# Patient Record
Sex: Female | Born: 2000 | Race: White | Hispanic: No | Marital: Single | Smoking: Never smoker
Health system: Southern US, Community
[De-identification: ages and names within clinical notes are randomized; demographics above are authoritative.]

## PROBLEM LIST (undated history)

## (undated) DIAGNOSIS — Z789 Other specified health status: Secondary | ICD-10-CM

## (undated) HISTORY — DX: Other specified health status: Z78.9

## (undated) HISTORY — PX: WISDOM TOOTH EXTRACTION: SHX21

---
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
## 2019-11-07 ENCOUNTER — Other Ambulatory Visit (HOSPITAL_COMMUNITY)
Admission: RE | Admit: 2019-11-07 | Discharge: 2019-11-07 | Disposition: A | Discharge status: Home / Self Care | Payer: BC Managed Care – PPO | Source: Ambulatory Visit | Attending: Obstetrics and Gynecology | Admitting: Obstetrics and Gynecology

## 2019-11-07 ENCOUNTER — Other Ambulatory Visit: Payer: Self-pay

## 2019-11-07 ENCOUNTER — Ambulatory Visit (INDEPENDENT_AMBULATORY_CARE_PROVIDER_SITE_OTHER): Payer: BC Managed Care – PPO | Admitting: Obstetrics and Gynecology

## 2019-11-07 ENCOUNTER — Encounter: Payer: Self-pay | Admitting: Obstetrics and Gynecology

## 2019-11-07 VITALS — BP 118/70 | HR 67 | Resp 16 | Ht 70.0 in | Wt 226.2 lb

## 2019-11-07 DIAGNOSIS — Z0000 Encounter for general adult medical examination without abnormal findings: Secondary | ICD-10-CM | POA: Diagnosis not present

## 2019-11-07 DIAGNOSIS — Z124 Encounter for screening for malignant neoplasm of cervix: Secondary | ICD-10-CM

## 2019-11-07 DIAGNOSIS — Z01419 Encounter for gynecological examination (general) (routine) without abnormal findings: Secondary | ICD-10-CM | POA: Diagnosis not present

## 2019-11-07 DIAGNOSIS — Z113 Encounter for screening for infections with a predominantly sexual mode of transmission: Secondary | ICD-10-CM | POA: Diagnosis not present

## 2019-11-07 NOTE — Progress Notes (Signed)
Gynecology Annual Exam  PCP: Patient, No Pcp Per  Chief Complaint:  Chief Complaint  Patient presents with  . Gynecologic Exam    History of Present Illness: Patient is a 19 y.o. No obstetric history on file. presents for annual exam. The patient has no complaints today.   LMP: Patient's last menstrual period was 10/23/2019. Average Interval: regular, 28 days Duration of flow: 5 days Heavy Menses: no Clots: no Intermenstrual Bleeding: no Postcoital Bleeding: no Dysmenorrhea: no  The patient is not currently sexually active. She currently uses abstinence for contraception.  There is no notable family history of breast or ovarian cancer in her family.  The patient has regular exercise: yes.    The patient denies current symptoms of depression.    Review of Systems: Review of Systems  Constitutional: Negative for chills, fever, malaise/fatigue and weight loss.  HENT: Negative for congestion, hearing loss and sinus pain.   Eyes: Negative for blurred vision and double vision.  Respiratory: Negative for cough, sputum production, shortness of breath and wheezing.   Cardiovascular: Negative for chest pain, palpitations, orthopnea and leg swelling.  Gastrointestinal: Negative for abdominal pain, constipation, diarrhea, nausea and vomiting.  Genitourinary: Negative for dysuria, flank pain, frequency, hematuria and urgency.  Musculoskeletal: Negative for back pain, falls and joint pain.  Skin: Negative for itching and rash.  Neurological: Negative for dizziness and headaches.  Psychiatric/Behavioral: Negative for depression, substance abuse and suicidal ideas. The patient is not nervous/anxious.     Past Medical History:  There are no problems to display for this patient.   Past Surgical History:  History reviewed. No pertinent surgical history.  Gynecologic History:  Patient's last menstrual period was 10/23/2019. Contraception: abstinence Last Pap: never  Obstetric  History: No obstetric history on file.  Family History:  History reviewed. No pertinent family history.  Social History:  Social History   Socioeconomic History  . Marital status: Single    Spouse name: Not on file  . Number of children: Not on file  . Years of education: Not on file  . Highest education level: Not on file  Occupational History  . Not on file  Tobacco Use  . Smoking status: Not on file  Substance and Sexual Activity  . Alcohol use: Not on file  . Drug use: Not on file  . Sexual activity: Not on file  Other Topics Concern  . Not on file  Social History Narrative  . Not on file   Social Determinants of Health   Financial Resource Strain:   . Difficulty of Paying Living Expenses: Not on file  Food Insecurity:   . Worried About Programme researcher, broadcasting/film/video in the Last Year: Not on file  . Ran Out of Food in the Last Year: Not on file  Transportation Needs:   . Lack of Transportation (Medical): Not on file  . Lack of Transportation (Non-Medical): Not on file  Physical Activity:   . Days of Exercise per Week: Not on file  . Minutes of Exercise per Session: Not on file  Stress:   . Feeling of Stress : Not on file  Social Connections:   . Frequency of Communication with Friends and Family: Not on file  . Frequency of Social Gatherings with Friends and Family: Not on file  . Attends Religious Services: Not on file  . Active Member of Clubs or Organizations: Not on file  . Attends Banker Meetings: Not on file  . Marital  Status: Not on file  Intimate Partner Violence:   . Fear of Current or Ex-Partner: Not on file  . Emotionally Abused: Not on file  . Physically Abused: Not on file  . Sexually Abused: Not on file    Allergies:  No Known Allergies  Medications: Prior to Admission medications   Not on File    Physical Exam Vitals: Blood pressure 118/70, pulse 67, resp. rate 16, height 5\' 10"  (1.778 m), weight 226 lb 3.2 oz (102.6 kg), last  menstrual period 10/23/2019, SpO2 99 %.  General: NAD HEENT: normocephalic, anicteric Thyroid: no enlargement, no palpable nodules Pulmonary: No increased work of breathing, CTAB Cardiovascular: RRR, distal pulses 2+ Breast: Breast symmetrical, no tenderness, no palpable nodules or masses, no skin or nipple retraction present, no nipple discharge.  No axillary or supraclavicular lymphadenopathy. Abdomen: NABS, soft, non-tender, non-distended.  Umbilicus without lesions.  No hepatomegaly, splenomegaly or masses palpable. No evidence of hernia  Genitourinary:  External: Normal external female genitalia.  Normal urethral meatus, normal Bartholin's and Skene's glands.    Vagina: Normal vaginal mucosa, no evidence of prolapse.    Cervix: Grossly normal in appearance, no bleeding  Uterus: Non-enlarged, mobile, normal contour.  No CMT  Adnexa: ovaries non-enlarged, no adnexal masses  Rectal: deferred  Lymphatic: no evidence of inguinal lymphadenopathy Extremities: no edema, erythema, or tenderness Neurologic: Grossly intact Psychiatric: mood appropriate, affect full  Female chaperone present for pelvic and breast  portions of the physical exam    Assessment: 19 y.o. No obstetric history on file. routine annual exam  Plan: Problem List Items Addressed This Visit    None    Visit Diagnoses    Cervical cancer screening    -  Primary   Relevant Orders   Cytology, thin prep pap (cervical)   Screen for STD (sexually transmitted disease)       Relevant Orders   Cytology, thin prep pap (cervical)   Health maintenance examination       Encounter for annual routine gynecological examination       Encounter for gynecological examination without abnormal finding          1) STI screening  was offered and accepted  2)  ASCCP guidelines and rational discussed.  Patient opts for every 3 years screening interval  3) Contraception - the patient is currently using  abstinence.  She is happy  with her current form of contraception and plans to continue We discussed safe sex practices to reduce her furture risk of STI's.    4) Concern for BV or yeast, wet prep normal today in office  5) Return in about 1 year (around 11/06/2020) for annual.  01/06/2021 MD, Adelene Idler [facility], [facility] 11/07/2019 12:38 PM

## 2019-11-13 LAB — CYTOLOGY - PAP
Chlamydia: NEGATIVE
Comment: NEGATIVE
Comment: NORMAL
Diagnosis: NEGATIVE
Neisseria Gonorrhea: NEGATIVE

## 2019-11-13 NOTE — Progress Notes (Signed)
Please call patient with normal pa result

## 2023-07-04 NOTE — Progress Notes (Unsigned)
   PCP:  Patient, No Pcp Per   No chief complaint on file.    HPI:      [name] is a 23 y.o. No obstetric history on file. whose LMP was No LMP recorded., presents today for her NP > 3 yrs annual examination.  Her menses are regular every 28-30 days, lasting 5 days.  Dysmenorrhea {dysmen:716}. She {does:18564} have intermenstrual bleeding.  Sex activity: {sex active: 315163}.  Last Pap: 11/07/19  Results were: no abnormalities  Hx of STDs: {STD hx:14358}  There is no FH of breast cancer. There is no FH of ovarian cancer. The patient {does:18564} do self-breast exams.  Tobacco use: {tob:20664} Alcohol use: {Alcohol:11675} No drug use.  Exercise: {exercise:31265}  She {does:18564} get adequate calcium and Vitamin D in her diet.  There are no active problems to display for this patient.   No past surgical history on file.  No family history on file.  Social History   Socioeconomic History   Marital status: Single    Spouse name: Not on file   Number of children: Not on file   Years of education: Not on file   Highest education level: Not on file  Occupational History   Not on file  Tobacco Use   Smoking status: Never   Smokeless tobacco: Never  Substance and Sexual Activity   Alcohol use: Not Currently   Drug use: Not Currently   Sexual activity: Not Currently  Other Topics Concern   Not on file  Social History Narrative   Not on file   Social Drivers of Health   Financial Resource Strain: Low Risk  (10/12/2021)   Received from [facility]   Overall Financial Resource Strain (CARDIA)    Difficulty of Paying Living Expenses: Not hard at all  Food Insecurity: No Food Insecurity (10/12/2021)   Received from [facility]   Hunger Vital Sign    Worried About Running Out of Food in the Last Year: Never true    Ran Out of Food in the Last Year: Never true  Transportation Needs: No Transportation Needs (10/12/2021)    Received from [facility] - Transportation    In the past 12 months, has lack of transportation kept you from medical appointments or from getting medications?: No    Lack of Transportation (Non-Medical): No  Physical Activity: Not on file  Stress: Not on file  Social Connections: Not on file  Intimate Partner Violence: Not on file    No current outpatient medications on file.     ROS:  Review of Systems BREAST: No symptoms   Objective: There were no vitals taken for this visit.   OBGyn Exam  Results: No results found for this or any previous visit (from the past 24 hours).  Assessment/Plan: No diagnosis found.  No orders of the defined types were placed in this encounter.            GYN counsel {counseling: 16159}     F/U  No follow-ups on file.  Janesa Dockery B. Ripken Rekowski, PA-C 07/04/2023 8:43 PM

## 2023-07-05 ENCOUNTER — Ambulatory Visit (INDEPENDENT_AMBULATORY_CARE_PROVIDER_SITE_OTHER): Payer: Self-pay | Admitting: Obstetrics and Gynecology

## 2023-07-05 ENCOUNTER — Other Ambulatory Visit (HOSPITAL_COMMUNITY)
Admission: RE | Admit: 2023-07-05 | Discharge: 2023-07-05 | Disposition: A | Discharge status: Home / Self Care | Source: Ambulatory Visit | Attending: Obstetrics and Gynecology | Admitting: Obstetrics and Gynecology

## 2023-07-05 ENCOUNTER — Encounter: Payer: Self-pay | Admitting: Obstetrics and Gynecology

## 2023-07-05 VITALS — BP 105/69 | HR 78 | Ht 69.0 in | Wt 169.0 lb

## 2023-07-05 DIAGNOSIS — Z124 Encounter for screening for malignant neoplasm of cervix: Secondary | ICD-10-CM

## 2023-07-05 DIAGNOSIS — Z01419 Encounter for gynecological examination (general) (routine) without abnormal findings: Secondary | ICD-10-CM

## 2023-07-05 DIAGNOSIS — Z113 Encounter for screening for infections with a predominantly sexual mode of transmission: Secondary | ICD-10-CM

## 2023-07-05 NOTE — Patient Instructions (Signed)
 I value your feedback and you entrusting [place] with your care. If you get a [place] patient survey, I would appreciate you taking the time to let us know about your experience today. Thank you! ? ? ?

## 2023-07-09 LAB — CYTOLOGY - PAP
Adequacy: ABSENT
Diagnosis: NEGATIVE
